# Patient Record
Sex: Male | Born: 1980 | Race: White | Hispanic: No | Marital: Married | Smoking: Current every day smoker
Health system: Southern US, Community
[De-identification: ages and names within clinical notes are randomized; demographics above are authoritative.]

## PROBLEM LIST (undated history)

## (undated) DIAGNOSIS — F32A Depression, unspecified: Secondary | ICD-10-CM

## (undated) DIAGNOSIS — I10 Essential (primary) hypertension: Secondary | ICD-10-CM

## (undated) DIAGNOSIS — F1010 Alcohol abuse, uncomplicated: Secondary | ICD-10-CM

## (undated) DIAGNOSIS — F329 Major depressive disorder, single episode, unspecified: Secondary | ICD-10-CM

## (undated) HISTORY — PX: WRIST SURGERY: SHX841

---

## 1999-07-14 ENCOUNTER — Emergency Department (HOSPITAL_COMMUNITY): Admission: EM | Admit: 1999-07-14 | Discharge: 1999-07-14 | Payer: Self-pay

## 1999-07-15 ENCOUNTER — Emergency Department (HOSPITAL_COMMUNITY): Admission: EM | Admit: 1999-07-15 | Discharge: 1999-07-15 | Payer: Self-pay

## 1999-07-15 ENCOUNTER — Encounter: Payer: Self-pay | Admitting: Emergency Medicine

## 2009-03-24 ENCOUNTER — Emergency Department (HOSPITAL_COMMUNITY): Admission: EM | Admit: 2009-03-24 | Discharge: 2009-03-24 | Payer: Self-pay | Admitting: Emergency Medicine

## 2009-10-01 ENCOUNTER — Emergency Department (HOSPITAL_COMMUNITY): Admission: EM | Admit: 2009-10-01 | Discharge: 2009-10-01 | Payer: Self-pay | Admitting: Emergency Medicine

## 2009-12-09 ENCOUNTER — Emergency Department (HOSPITAL_COMMUNITY): Admission: EM | Admit: 2009-12-09 | Discharge: 2009-12-09 | Payer: Self-pay | Admitting: Emergency Medicine

## 2010-02-20 ENCOUNTER — Emergency Department (HOSPITAL_COMMUNITY): Admission: EM | Admit: 2010-02-20 | Discharge: 2010-02-21 | Payer: Self-pay | Admitting: Emergency Medicine

## 2010-03-17 ENCOUNTER — Emergency Department (HOSPITAL_COMMUNITY): Admission: EM | Admit: 2010-03-17 | Discharge: 2010-03-17 | Payer: Self-pay | Admitting: Emergency Medicine

## 2011-01-26 LAB — RAPID URINE DRUG SCREEN, HOSP PERFORMED
Barbiturates: NOT DETECTED
Cocaine: POSITIVE — AB
Opiates: NOT DETECTED

## 2011-01-26 LAB — POCT I-STAT, CHEM 8
BUN: 11 mg/dL (ref 6–23)
Calcium, Ion: 1.13 mmol/L (ref 1.12–1.32)
Chloride: 108 mEq/L (ref 96–112)
Glucose, Bld: 116 mg/dL — ABNORMAL HIGH (ref 70–99)

## 2011-01-28 LAB — RAPID URINE DRUG SCREEN, HOSP PERFORMED
Benzodiazepines: POSITIVE — AB
Cocaine: POSITIVE — AB
Opiates: POSITIVE — AB
Tetrahydrocannabinol: NOT DETECTED

## 2011-01-28 LAB — DIFFERENTIAL
Basophils Absolute: 0.1 10*3/uL (ref 0.0–0.1)
Basophils Relative: 1 % (ref 0–1)
Eosinophils Absolute: 0.3 10*3/uL (ref 0.0–0.7)
Eosinophils Relative: 4 % (ref 0–5)
Lymphocytes Relative: 29 % (ref 12–46)
Monocytes Absolute: 0.5 10*3/uL (ref 0.1–1.0)

## 2011-01-28 LAB — CBC
HCT: 41.8 % (ref 39.0–52.0)
MCHC: 34.2 g/dL (ref 30.0–36.0)
Platelets: 222 10*3/uL (ref 150–400)
RDW: 13.3 % (ref 11.5–15.5)

## 2011-01-28 LAB — BASIC METABOLIC PANEL
BUN: 8 mg/dL (ref 6–23)
CO2: 26 mEq/L (ref 19–32)
Calcium: 9 mg/dL (ref 8.4–10.5)
GFR calc non Af Amer: >60 mL/min (ref >60)
Glucose, Bld: 97 mg/dL (ref 70–99)
Potassium: 3.7 mEq/L (ref 3.5–5.1)

## 2011-01-28 LAB — TRICYCLICS SCREEN, URINE: TCA Scrn: NOT DETECTED

## 2011-02-17 LAB — POCT I-STAT, CHEM 8
BUN: 12 mg/dL (ref 6–23)
Calcium, Ion: 1.17 mmol/L (ref 1.12–1.32)
HCT: 38 % — ABNORMAL LOW (ref 39.0–52.0)
Hemoglobin: 12.9 g/dL — ABNORMAL LOW (ref 13.0–17.0)
Sodium: 142 mEq/L (ref 135–145)
TCO2: 21 mmol/L (ref 0–100)

## 2011-10-12 ENCOUNTER — Encounter: Payer: Self-pay | Admitting: *Deleted

## 2011-10-12 ENCOUNTER — Emergency Department (HOSPITAL_COMMUNITY)
Admission: EM | Admit: 2011-10-12 | Discharge: 2011-10-13 | Disposition: A | Discharge status: Other Acute Inpatient Hospital | Payer: Self-pay | Attending: Emergency Medicine | Admitting: Emergency Medicine

## 2011-10-12 DIAGNOSIS — F3289 Other specified depressive episodes: Secondary | ICD-10-CM | POA: Insufficient documentation

## 2011-10-12 DIAGNOSIS — F329 Major depressive disorder, single episode, unspecified: Secondary | ICD-10-CM | POA: Insufficient documentation

## 2011-10-12 DIAGNOSIS — F1020 Alcohol dependence, uncomplicated: Secondary | ICD-10-CM | POA: Insufficient documentation

## 2011-10-12 DIAGNOSIS — F1910 Other psychoactive substance abuse, uncomplicated: Secondary | ICD-10-CM

## 2011-10-12 DIAGNOSIS — F1920 Other psychoactive substance dependence, uncomplicated: Secondary | ICD-10-CM | POA: Insufficient documentation

## 2011-10-12 DIAGNOSIS — R45851 Suicidal ideations: Secondary | ICD-10-CM | POA: Insufficient documentation

## 2011-10-12 DIAGNOSIS — I10 Essential (primary) hypertension: Secondary | ICD-10-CM | POA: Insufficient documentation

## 2011-10-12 DIAGNOSIS — F39 Unspecified mood [affective] disorder: Secondary | ICD-10-CM | POA: Insufficient documentation

## 2011-10-12 HISTORY — DX: Essential (primary) hypertension: I10

## 2011-10-12 HISTORY — DX: Major depressive disorder, single episode, unspecified: F32.9

## 2011-10-12 HISTORY — DX: Alcohol abuse, uncomplicated: F10.10

## 2011-10-12 HISTORY — DX: Depression, unspecified: F32.A

## 2011-10-12 LAB — CBC
HCT: 45 % (ref 39.0–52.0)
Hemoglobin: 15.5 g/dL (ref 13.0–17.0)
MCH: 30.2 pg (ref 26.0–34.0)
MCV: 87.7 fL (ref 78.0–100.0)
Platelets: 252 10*3/uL (ref 150–400)
RBC: 5.13 MIL/uL (ref 4.22–5.81)
WBC: 8.3 10*3/uL (ref 4.0–10.5)

## 2011-10-12 LAB — DIFFERENTIAL
Eosinophils Absolute: 0.1 10*3/uL (ref 0.0–0.7)
Eosinophils Relative: 1 % (ref 0–5)
Lymphocytes Relative: 24 % (ref 12–46)
Lymphs Abs: 2 10*3/uL (ref 0.7–4.0)
Monocytes Relative: 4 % (ref 3–12)

## 2011-10-12 LAB — BASIC METABOLIC PANEL
BUN: 3 mg/dL — ABNORMAL LOW (ref 6–23)
CO2: 27 mEq/L (ref 19–32)
Calcium: 9.5 mg/dL (ref 8.4–10.5)
GFR calc non Af Amer: >90 mL/min (ref >90)
Glucose, Bld: 106 mg/dL — ABNORMAL HIGH (ref 70–99)
Sodium: 144 mEq/L (ref 135–145)

## 2011-10-12 LAB — RAPID URINE DRUG SCREEN, HOSP PERFORMED: Opiates: NOT DETECTED

## 2011-10-12 LAB — ETHANOL: Alcohol, Ethyl (B): 240 mg/dL — ABNORMAL HIGH (ref 0–11)

## 2011-10-12 NOTE — ED Notes (Signed)
Pt arrived via [facility] it is reported he "wanted to blow his brains out"
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
## 2011-10-12 NOTE — ED Notes (Signed)
Act team counselor in with pt

## 2011-10-13 MED ORDER — METOPROLOL TARTRATE 50 MG PO TABS
50.0000 mg | ORAL_TABLET | Freq: Once | ORAL | Status: AC
  Administered 2011-10-13: 50 mg via ORAL
  Filled 2011-10-13: qty 1

## 2011-10-13 MED ORDER — LISINOPRIL 10 MG PO TABS
20.0000 mg | ORAL_TABLET | Freq: Every day | ORAL | Status: DC
  Administered 2011-10-13: 20 mg via ORAL
  Filled 2011-10-13 (×2): qty 1

## 2011-10-13 NOTE — ED Notes (Signed)
[facility] arrived to transport pt, pt's bp was 172/119.  Called [facility] to verify bed, stated that pt's bp must be <100 diastolic before acceptance.  MD made aware.

## 2011-10-13 NOTE — BH Assessment (Signed)
Assessment Note   Kevin House is an 30 y.o. male. Kevin House had been accepted to [facility] this am. He was waiting for transportation by [facility]. When they arrived his blood pressure and not responded to medication. Call made to [facility], they requested that his pressure be stabilized before transport.  Later Kevin House stated that  He was better and that he was not suicidal. He states that he no longer wants to go inpatient. Kevin House's father is in the ED at this time and this was addressed with him.  Axis I: Substance Induced Mood Disorder and Alcohol dependence Axis II: Deferred Axis III:  Past Medical History  Diagnosis Date  . Hypertension   . Depressed   . Alcohol abuse    Axis IV: economic problems, housing problems, occupational problems, other psychosocial or environmental problems, problems with access to health care services and problems with primary support group Axis V: 41-50 serious symptoms   Past Medical History:  Past Medical History  Diagnosis Date  . Hypertension   . Depressed   . Alcohol abuse     No past surgical history on file.  Family History: No family history on file.  Social History:  reports that he has been smoking.  He does not have any smokeless tobacco history on file. He reports that he drinks alcohol. His drug history not on file.  Allergies:  Allergies  Allergen Reactions  . Penicillins     Home Medications:  Medications Prior to Admission  Medication Dose Route Frequency Provider Last Rate Last Dose  . lisinopril (PRINIVIL,ZESTRIL) tablet 20 mg  20 mg Oral Daily Nicoletta Dress. Colon Branch, MD   20 mg at 10/13/11 1006  . metoprolol (LOPRESSOR) tablet 50 mg  50 mg Oral Once [employer]. Colon Branch, MD   50 mg at 10/13/11 0936   No current outpatient prescriptions on file as of 10/12/2011.    OB/GYN Status:  No LMP for male patient.  General Assessment Data Assessment Number: 1  Living Arrangements: Parent Can pt return to current  living arrangement?: Yes Admission Status: Voluntary Is patient capable of signing voluntary admission?: Yes Transfer from: Acute Hospital Referral Source: Self/Family/Friend  Risk to self Suicidal Ideation: Yes-Currently Present Suicidal Intent: Yes-Currently Present Is patient at risk for suicide?: Yes Suicidal Plan?: Yes-Currently Present Specify Current Suicidal Plan: BLOW BRAINS OUT WITH FATHER GUN Access to Means: Yes Specify Access to Suicidal Means: FATHER'S GUN What has been your use of drugs/alcohol within the last 12 months?: PT HAS BEEN USING ETOH, OPIOIDS, BENZOS SINCE THE AGE OF 15. BENZOS: USES 83M DAILY FOR 48YRS, LAST USE WAS 3 DAYS AGO; HEROINE: USES 4G DAILY & LAST USE WAS JULY 31ST; ETOH  VARIES & LAST USE WAS TODAY 2 PINTS; OXYCOTON USES 10- 40 MG DAILY & LAST USE WAS 1 WK AGO Other Self Harm Risks: NA Triggers for Past Attempts: Family contact;Other (Comment) (DRUG USE, RELATIONSHIP ISSUES) Intentional Self Injurious Behavior: None Factors that decrease suicide risk: Positive coping skills Family Suicide History: No Recent stressful life event(s): Job Loss;Financial Problems;Conflict (Comment) (RELATIONSHIP ISSUES WITH PARENTS & GF) Persecutory voices/beliefs?: No Depression: Yes Depression Symptoms: Tearfulness;Fatigue;Feeling angry/irritable;Loss of interest in usual pleasures;Feeling worthless/self pity Substance abuse history and/or treatment for substance abuse?: Yes Suicide prevention information given to non-admitted patients: Not applicable  Risk to Others Homicidal Ideation: No Thoughts of Harm to Others: No Current Homicidal Intent: No Current Homicidal Plan: No Access to Homicidal Means: No Identified Victim: NA  History of harm to others?: No Assessment of Violence: None Noted Violent Behavior Description: EMOTIOAL, TEARFUL, SLIGHT AGITATION Does patient have access to weapons?: Yes (Comment) Criminal Charges Pending?: No Does patient have a  court date: No  Mental Status Report Appear/Hygiene: Other (Comment) (NEAT) Eye Contact: Fair Motor Activity: Agitation;Mannerisms Speech: Pressured Level of Consciousness: Crying Mood: Depressed;Angry;Despair;Irritable Affect: Angry;Depressed;Irritable;Sad Anxiety Level: Minimal Thought Processes: Coherent;Relevant Judgement: Impaired Orientation: Person;Place;Time;Situation Obsessive Compulsive Thoughts/Behaviors: None  Cognitive Functioning Concentration: Decreased Memory: Recent Intact;Remote Intact IQ: Average Insight: Poor Impulse Control: Poor Appetite: Fair Weight Loss: 0  Weight Gain: 0  Sleep: Decreased Total Hours of Sleep: 4  Vegetative Symptoms: None  Prior Inpatient/Outpatient Therapy Prior Therapy: Inpatient Prior Therapy Dates: 2011; 2012 Prior Therapy Facilty/Provider(s): [facility] Reason for Treatment: DETOX & REHAB                     Additional Information 1:1 In Past 12 Months?: No CIRT Risk: No Elopement Risk: No Does patient have medical clearance?: Yes     Disposition:  Disposition Disposition of Patient: Inpatient treatment program;Referred to [facility], [facility]) Type of inpatient treatment program: Adult Patient referred to: Other (Comment) ([facility], [facility] & [facility])  The patient declined admission to [facility] today. He decided that he was going home. He contacted his father to come for him. The patient and his father agreed for him to return under contract for safety. The patient will continue with NA/AA meetings. He declined a referral to [facility]. Patient discharged by Dr Colon Branch to home. On Site Evaluation by:   Reviewed with Physician:     Jake Shark [facility] 10/13/2011 3:04 PM

## 2011-10-13 NOTE — ED Notes (Signed)
[facility] called for transport to [facility].  Thomas at [facility] be awhile, but will dispatch a truck as soon as possible".

## 2011-10-13 NOTE — ED Notes (Signed)
Pt wanting to leave AMA.  Notified edp, and Tommy form ACT team.  Tommy at bedside with pt and pt's father.

## 2011-10-13 NOTE — ED Provider Notes (Signed)
History     CSN: [id number] Arrival date & time: 10/12/2011  9:38 PM   None     Chief Complaint  Patient presents with  . Suicidal    (Consider location/radiation/quality/duration/timing/severity/associated sxs/prior treatment) HPI Comments: PT HAS BEEN ABUSING HEROIN, PERCOCET AND "ANYTHING ELSE I CAN PUT IN A NEEDLE AND SHOVE IN MY VEINS"`  Having suicidal thoughts tonight.  States he went to a job interview today and "things didn't go well".  He does not feel that he needs detox just help with suicidal ideation.  His plan is to "shoot myself".  Relates that he has multiple firearms around his house.  Says he came to the ED of his own volition  The history is provided by the patient. No language interpreter was used.    Past Medical History  Diagnosis Date  . Hypertension   . Depressed   . Alcohol abuse     No past surgical history on file.  No family history on file.  History  Substance Use Topics  . Smoking status: Current Everyday Smoker  . Smokeless tobacco: Not on file  . Alcohol Use: Yes      Review of Systems  Psychiatric/Behavioral: Positive for suicidal ideas.    Allergies  Penicillins  Home Medications  No current outpatient prescriptions on file.  There were no vitals taken for this visit.  Physical Exam  Nursing note and vitals reviewed. Constitutional: He is oriented to person, place, and time. He appears well-developed and well-nourished.  HENT:  Head: Normocephalic and atraumatic.  Eyes: EOM are normal.  Neck: Normal range of motion.  Cardiovascular: Normal rate, regular rhythm, normal heart sounds and intact distal pulses.  Exam reveals no gallop and no friction rub.   No murmur heard. Pulmonary/Chest: Effort normal and breath sounds normal. No respiratory distress. He has no wheezes. He has no rales. He exhibits no tenderness.  Abdominal: Soft. Bowel sounds are normal. He exhibits no distension. There is no tenderness. There is no  rigidity, no rebound, no guarding and no CVA tenderness.  Musculoskeletal: Normal range of motion.  Neurological: He is alert and oriented to person, place, and time.  Skin: Skin is warm and dry.  Psychiatric: His speech is normal. Judgment normal. His mood appears anxious. He is agitated. Thought content is not paranoid and not delusional. He exhibits a depressed mood. He expresses suicidal ideation. He expresses no homicidal ideation. He expresses suicidal plans. He expresses no homicidal plans.    ED Course  Procedures (including critical care time)  Labs Reviewed  ETHANOL - Abnormal; Notable for the following:    Alcohol, Ethyl (B) 240 (*)    All other components within normal limits  BASIC METABOLIC PANEL - Abnormal; Notable for the following:    Glucose, Bld 106 (*)    BUN 3 (*)    All other components within normal limits  URINE RAPID DRUG SCREEN (HOSP PERFORMED)  CBC  DIFFERENTIAL   No results found.   No diagnosis found.    MDM  12:50 AMdiscussed with dr. Jeraldine Loots who has assumed care.  Pt has already spoken with ella from [facility] who is working on placement.        Worthy Rancher, PA 10/25/11 1343

## 2011-10-13 NOTE — ED Provider Notes (Signed)
The patient has been accepted to [facility], and will be transferred this morning. He is awake and cooperative  Gerhard Munch, MD 10/13/11 [phone]

## 2011-10-13 NOTE — Progress Notes (Signed)
4098 Patient here awaiting bed. H/o polysubstance abuse wanting detox. He has a h/o hypertension and has not been taking his medicines for over 3 months. Will restart his regular medicines. Do not expect to have his blood pressure normalized while in the ER.  0900 Advised that patient has been accepted by Dr. Betti Cruz at [facility]. Patient is stable and medically cleared to go.

## 2011-10-13 NOTE — ED Notes (Signed)
[facility] called and gave report, in route to transport pt ETA 15 min

## 2011-10-25 NOTE — ED Provider Notes (Signed)
Medical screening examination/treatment/procedure(s) were performed by non-physician practitioner and as supervising physician I was immediately available for consultation/collaboration.   Gerhard Munch, MD 10/25/11 [phone]

## 2016-09-28 ENCOUNTER — Ambulatory Visit (INDEPENDENT_AMBULATORY_CARE_PROVIDER_SITE_OTHER): Payer: Self-pay | Admitting: Orthopaedic Surgery

## 2016-10-05 ENCOUNTER — Encounter (INDEPENDENT_AMBULATORY_CARE_PROVIDER_SITE_OTHER): Payer: Self-pay | Admitting: Orthopaedic Surgery

## 2016-10-05 ENCOUNTER — Ambulatory Visit (INDEPENDENT_AMBULATORY_CARE_PROVIDER_SITE_OTHER): Payer: BLUE CROSS/BLUE SHIELD | Admitting: Orthopaedic Surgery

## 2016-10-05 ENCOUNTER — Ambulatory Visit (INDEPENDENT_AMBULATORY_CARE_PROVIDER_SITE_OTHER): Payer: Self-pay

## 2016-10-05 VITALS — BP 183/119 | HR 82 | Ht 72.0 in | Wt 235.0 lb

## 2016-10-05 DIAGNOSIS — M25511 Pain in right shoulder: Secondary | ICD-10-CM

## 2016-10-05 DIAGNOSIS — G8929 Other chronic pain: Secondary | ICD-10-CM | POA: Diagnosis not present

## 2016-10-05 MED ORDER — METHYLPREDNISOLONE ACETATE 40 MG/ML IJ SUSP
80.0000 mg | INTRAMUSCULAR | Status: AC | PRN
  Administered 2016-10-05: 80 mg

## 2016-10-05 MED ORDER — LIDOCAINE HCL 1 % IJ SOLN
2.0000 mL | INTRAMUSCULAR | Status: AC | PRN
  Administered 2016-10-05: 2 mL

## 2016-10-05 MED ORDER — BUPIVACAINE HCL 0.5 % IJ SOLN
2.0000 mL | INTRAMUSCULAR | Status: AC | PRN
  Administered 2016-10-05: 2 mL via INTRA_ARTICULAR

## 2016-10-05 NOTE — Progress Notes (Signed)
Office Visit Note   Patient: Kevin PedroDavid R Fix Jr.           Date of Birth: Apr 10, 1981           MRN: [id number] Visit Date: 10/05/2016              Requested by: No referring provider defined for this encounter. PCP: Luvenia StarchSANTAYANA, GLORIA PATRICIA, DO   Assessment & Plan: Impingement syndrome right shoulder. I will plan on performing a subacromial cortisone injection and monitor his response. Also monitor  the numbness and tingling into the long and ring finger  right hand Visit Diagnoses: No diagnosis found.  Plan: f/u in 2 weeks. I would like to monitor the response to the cortisone of the right shoulder and also to evaluate the numbness and tingling is experiencing in his right hand that might be carpal tunnel syndrome.  Follow-Up Instructions: No Follow-up on file.   Orders:  No orders of the defined types were placed in this encounter.  No orders of the defined types were placed in this encounter.     Procedures: Large Joint Inj Date/Time: 10/05/2016 5:40 PM Performed by: Valeria BatmanWHITFIELD, Shelisha Gautier W Authorized by: Valeria BatmanWHITFIELD, Jesenya Bowditch W   Consent Given by:  Patient Timeout: prior to procedure the correct patient, procedure, and site was verified   Indications:  Pain Location:  Shoulder Site:  L subacromial bursa Prep: patient was prepped and draped in usual sterile fashion   Needle Size:  25 G Needle Length:  1.5 inches Approach:  Lateral Ultrasound Guidance: No   Fluoroscopic Guidance: No   Arthrogram: No   Medications:  80 mg methylPREDNISolone acetate 40 MG/ML; 2 mL lidocaine 1 %; 2 mL bupivacaine 0.5 % Aspiration Attempted: No   Patient tolerance:  Patient tolerated the procedure well with no immediate complications     Clinical Data: No additional findings.   Subjective: No chief complaint on file.   Pt Right shoulder "popping and grinding"  Denies injury Pt has had numbness and tingling in his Right hand  Extreme pain for 2 months.  Kevin House has been  experiencing persistent right shoulder pain over a period of months. 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 He denies a specific injury or trauma.. The pain is localized along the anterior aspect of the shoulder and oftentimes associated with "popping"  In addition he's had some trouble with numbness in the long and ring finger on occasion. Sometimes it occurs when he is driving a car and sometimes occurs when he is sleeping.  Review of Systems   Objective: Vital Signs: There were no vitals taken for this visit.  Physical Exam  Ortho Exam on examination of the right shoulder there was impingement on the extreme of external rotation. There was also a positive grind test. There was local tenderness over the anterior aspect of the shoulder beneath the acromion. There was no pain  at the acromioclavicular joint. Full range of motion of the shoulder with a minimally positive painful arc.   negative Tinel's at the wrist. Full range of motion of his hand and wrist.  Specialty Comments:  No specialty comments available.  Imaging: No results found.   PMFS History: There are no active problems to display for this patient.  Past Medical History:  Diagnosis Date  . Alcohol abuse   . Depressed   . Hypertension     No family history on file.  No past  surgical history on file. Social History   Occupational History  . Not on file.   Social History Main Topics  . Smoking status: Current Every Day Smoker  . Smokeless tobacco: Not on file  . Alcohol use Yes  . Drug use: Unknown  . Sexual activity: Not on file

## 2016-10-26 ENCOUNTER — Encounter (INDEPENDENT_AMBULATORY_CARE_PROVIDER_SITE_OTHER): Payer: Self-pay | Admitting: Orthopaedic Surgery

## 2016-10-26 ENCOUNTER — Ambulatory Visit (INDEPENDENT_AMBULATORY_CARE_PROVIDER_SITE_OTHER): Payer: BLUE CROSS/BLUE SHIELD | Admitting: Orthopaedic Surgery

## 2016-10-26 VITALS — BP 156/104 | HR 77 | Ht 72.0 in | Wt 235.0 lb

## 2016-10-26 DIAGNOSIS — M25511 Pain in right shoulder: Secondary | ICD-10-CM | POA: Diagnosis not present

## 2016-10-26 DIAGNOSIS — G8929 Other chronic pain: Secondary | ICD-10-CM

## 2016-10-26 NOTE — Progress Notes (Signed)
   Office Visit Note   Patient: Kevin PedroDavid R Crisanto Jr.           Date of Birth: 1981-06-15           MRN: [id number] Visit Date: 10/26/2016              Requested by: Kevin MullGloria House Santayana, Kevin House [address] [place], [place] [id number] PCP: Kevin House, Kevin PATRICIA, Kevin House   Assessment & Plan Visit Diagnoses: Resolved right shoulder pain status post subacromial cortisone injection. Chronic numbness and tingling into this right hand involving possibly ulna and median nerve   Plan: EMG and nerve conduction studies to right upper extremity Follow-Up Instructions: No Follow-up on file.   Orders:  No orders of the defined types were placed in this encounter.  No orders of the defined types were placed in this encounter.     Procedures: No procedures performed   Clinical Data: No additional findings.   Subjective: No chief complaint on file.   Chronic Right shoulder pain and some numbness in his hand "when I'm talking on the phone and my arm is on the armrest"  Discomfort and tingling is experiencing predominantly in the index long and ring finger  Review of Systems   Objective: Vital Signs: There were no vitals taken for this visit.  Physical Exam  Ortho Exam complete painless range of motion of right shoulder. Impingement testing is negative. Excellent strength. There were no localized areas of tenderness.  Examination of the right hand reveals no Tinel's over the median nerve. Good grip and good release. Normal sensibility today with normal motor exam. There was a Tinel's over the ulnar nerve at the right elbow  Specialty Comments:  No specialty comments available.  Imaging: No results found.   PMFS History: There are no active problems to display for this patient.  Past Medical History:  Diagnosis Date  . Alcohol abuse   . Depressed   . Hypertension     No family history on file.  No past surgical history on file. Social History   Occupational History    . Not on file.   Social History Main Topics  . Smoking status: Current Every Day Smoker  . Smokeless tobacco: Not on file  . Alcohol use Yes  . Drug use: Unknown  . Sexual activity: Not on file

## 2016-11-19 ENCOUNTER — Encounter: Payer: Self-pay | Admitting: *Deleted

## 2016-11-19 ENCOUNTER — Emergency Department
Admission: EM | Admit: 2016-11-19 | Discharge: 2016-11-19 | Disposition: A | Discharge status: Home / Self Care | Payer: BLUE CROSS/BLUE SHIELD | Attending: Emergency Medicine | Admitting: Emergency Medicine

## 2016-11-19 ENCOUNTER — Emergency Department: Payer: BLUE CROSS/BLUE SHIELD

## 2016-11-19 DIAGNOSIS — I10 Essential (primary) hypertension: Secondary | ICD-10-CM | POA: Diagnosis not present

## 2016-11-19 DIAGNOSIS — R51 Headache: Secondary | ICD-10-CM | POA: Diagnosis present

## 2016-11-19 DIAGNOSIS — Z79899 Other long term (current) drug therapy: Secondary | ICD-10-CM | POA: Insufficient documentation

## 2016-11-19 DIAGNOSIS — G43009 Migraine without aura, not intractable, without status migrainosus: Secondary | ICD-10-CM

## 2016-11-19 DIAGNOSIS — F17200 Nicotine dependence, unspecified, uncomplicated: Secondary | ICD-10-CM | POA: Diagnosis not present

## 2016-11-19 LAB — CBC WITH DIFFERENTIAL/PLATELET
Basophils Absolute: 0 10*3/uL (ref 0–0.1)
Basophils Relative: 1 %
EOS ABS: 0.2 10*3/uL (ref 0–0.7)
EOS PCT: 4 %
HCT: 43.9 % (ref 40.0–52.0)
HEMOGLOBIN: 15.1 g/dL (ref 13.0–18.0)
Lymphocytes Relative: 28 %
Lymphs Abs: 1.5 10*3/uL (ref 1.0–3.6)
MCH: 32.1 pg (ref 26.0–34.0)
MCHC: 34.5 g/dL (ref 32.0–36.0)
MCV: 93.2 fL (ref 80.0–100.0)
MONOS PCT: 8 %
Monocytes Absolute: 0.4 10*3/uL (ref 0.2–1.0)
NEUTROS PCT: 59 %
Neutro Abs: 3.2 10*3/uL (ref 1.4–6.5)
PLATELETS: 226 10*3/uL (ref 150–440)
RBC: 4.71 MIL/uL (ref 4.40–5.90)
RDW: 13.4 % (ref 11.5–14.5)
WBC: 5.4 10*3/uL (ref 3.8–10.6)

## 2016-11-19 LAB — COMPREHENSIVE METABOLIC PANEL
ALK PHOS: 60 U/L (ref 38–126)
ALT: 18 U/L (ref 17–63)
AST: 23 U/L (ref 15–41)
Albumin: 4.1 g/dL (ref 3.5–5.0)
Anion gap: 7 (ref 5–15)
BUN: 12 mg/dL (ref 6–20)
CALCIUM: 9.6 mg/dL (ref 8.9–10.3)
CO2: 26 mmol/L (ref 22–32)
CREATININE: 0.9 mg/dL (ref 0.61–1.24)
Chloride: 107 mmol/L (ref 101–111)
Glucose, Bld: 106 mg/dL — ABNORMAL HIGH (ref 65–99)
Potassium: 3.9 mmol/L (ref 3.5–5.1)
SODIUM: 140 mmol/L (ref 135–145)
Total Bilirubin: 0.8 mg/dL (ref 0.3–1.2)
Total Protein: 7 g/dL (ref 6.5–8.1)

## 2016-11-19 MED ORDER — LISINOPRIL-HYDROCHLOROTHIAZIDE 10-12.5 MG PO TABS: 1.0000 | ORAL_TABLET | Freq: Every day | ORAL | 2 refills | Status: AC

## 2016-11-19 MED ORDER — METOCLOPRAMIDE HCL 5 MG/ML IJ SOLN
10.0000 mg | Freq: Once | INTRAMUSCULAR | Status: AC
  Administered 2016-11-19: 10 mg via INTRAVENOUS
  Filled 2016-11-19: qty 2

## 2016-11-19 MED ORDER — CLONIDINE HCL 0.1 MG PO TABS
0.2000 mg | ORAL_TABLET | Freq: Once | ORAL | Status: AC
  Administered 2016-11-19: 0.2 mg via ORAL
  Filled 2016-11-19: qty 2

## 2016-11-19 MED ORDER — LISINOPRIL 10 MG PO TABS
10.0000 mg | ORAL_TABLET | Freq: Once | ORAL | Status: AC
  Administered 2016-11-19: 10 mg via ORAL
  Filled 2016-11-19: qty 1

## 2016-11-19 MED ORDER — DIPHENHYDRAMINE HCL 50 MG/ML IJ SOLN
50.0000 mg | Freq: Once | INTRAMUSCULAR | Status: AC
  Administered 2016-11-19: 50 mg via INTRAVENOUS
  Filled 2016-11-19: qty 1

## 2016-11-19 MED ORDER — HYDROCHLOROTHIAZIDE 25 MG PO TABS
25.0000 mg | ORAL_TABLET | Freq: Every day | ORAL | Status: DC
  Administered 2016-11-19: 25 mg via ORAL
  Filled 2016-11-19: qty 1

## 2016-11-19 MED ORDER — SODIUM CHLORIDE 0.9 % IV BOLUS (SEPSIS)
1000.0000 mL | Freq: Once | INTRAVENOUS | Status: AC
  Administered 2016-11-19: 1000 mL via INTRAVENOUS

## 2016-11-19 MED ORDER — KETOROLAC TROMETHAMINE 30 MG/ML IJ SOLN
30.0000 mg | Freq: Once | INTRAMUSCULAR | Status: AC
  Administered 2016-11-19: 30 mg via INTRAVENOUS
  Filled 2016-11-19: qty 1

## 2016-11-19 NOTE — ED Triage Notes (Signed)
Patient c/o headache for 3 days that began behind his eyes and now is in the back of head and neck. Patient has a history of blood pressure, but has not had them for 2 months.

## 2016-11-19 NOTE — ED Provider Notes (Signed)
[facility] Emergency Department Provider Note  Time seen: 4:12 PM  I have reviewed the triage vital signs and the nursing notes.   HISTORY  Chief Complaint Headache    HPI Kevin PedroDavid R Amison Jr. is a 36 y.o. male with a past medical history of alcohol use, depression and hypertension who presents the emergency department a headache for the past 3 days. According to the patient for the past 3 days he has had a significant headache located behind his eyes. Patient states he has been off blood pressure medications for the past one month, upon arrival to the emergency department patient has an elevated blood pressure around 200 systolic.Patient denies any fever or neck pain. Denies any focal weakness or numbness.  Past Medical History:  Diagnosis Date  . Alcohol abuse   . Depressed   . Hypertension     There are no active problems to display for this patient.   Past Surgical History:  Procedure Laterality Date  . WRIST SURGERY Right     Prior to Admission medications   Medication Sig Start Date End Date Taking? Authorizing Provider  amoxicillin (AMOXIL) 500 MG capsule  10/21/16   Historical Provider, MD  citalopram (CELEXA) 40 MG tablet Take 40 mg by mouth daily.    Historical Provider, MD  losartan-hydrochlorothiazide (HYZAAR) 50-12.5 MG tablet Take 1 tablet by mouth daily.    Historical Provider, MD  metoprolol (LOPRESSOR) 50 MG tablet Take 50 mg by mouth 2 (two) times daily.    Historical Provider, MD  ranitidine (ZANTAC) 150 MG tablet Take 150 mg by mouth 2 (two) times daily.    Historical Provider, MD  VIRTUSSIN A/C 100-10 MG/5ML syrup TK 5 ML PO Q 4 TO 6 HOURS PRN 10/22/16   Historical Provider, MD    Allergies  Allergen Reactions  . Penicillins     No family history on file.  Social History Social History  Substance Use Topics  . Smoking status: Current Every Day Smoker  . Smokeless tobacco: Never Used  . Alcohol use Yes    Review of  Systems Constitutional: Negative for fever. Cardiovascular: Negative for chest pain. Respiratory: Negative for shortness of breath. Gastrointestinal: Negative for abdominal pain Neurological: Moderate headache. Denies focal weakness or numbness. 10-point ROS otherwise negative.  ____________________________________________   PHYSICAL EXAM:  VITAL SIGNS: ED Triage Vitals  Enc Vitals Group     BP 11/19/16 1426 (!) 200/129     Pulse Rate 11/19/16 1426 91     Resp 11/19/16 1426 18     Temp 11/19/16 1426 98.1 F (36.7 C)     Temp Source 11/19/16 1426 Oral     SpO2 11/19/16 1426 97 %     Weight 11/19/16 1427 240 lb (108.9 kg)     Height 11/19/16 1427 6' (1.829 m)     Head Circumference --      Peak Flow --      Pain Score 11/19/16 1427 6     Pain Loc --      Pain Edu? --      Excl. in GC? --    Constitutional: Alert and oriented. Well appearing and in no distress. Eyes: Normal exam, Mild photophobia stated during exam. ENT   Head: Normocephalic and atraumatic.   Mouth/Throat: Mucous membranes are moist. Cardiovascular: Normal rate, regular rhythm. No murmur Respiratory: Normal respiratory effort without tachypnea nor retractions. Breath sounds are clear Gastrointestinal: Soft and nontender. No distention.   Musculoskeletal: Nontender with normal  range of motion in all extremities.  Neurologic:  Normal speech and language. No gross focal neurologic deficits. Equal grip strengths bilaterally. No pronator drift. Cranial nerves intact. Skin:  Skin is warm, dry and intact.  Psychiatric: Mood and affect are normal.   ____________________________________________    EKG  EKG reviewed and interpreted by myself shows normal sinus rhythm at 84 bpm, left axis deviation, normal intervals with no concerning ST changes.  ____________________________________________    RADIOLOGY  CT head negative  ____________________________________________   INITIAL IMPRESSION /  ASSESSMENT AND PLAN / ED COURSE  Pertinent labs & imaging results that were available during my care of the patient were reviewed by me and considered in my medical decision making (see chart for details).  Patient presents for 3 days of headache and elevated blood pressure. Patient's labs are largely within normal limits, EKG is normal. We'll obtain a CT scan of the head given no history of migraines or prolonged headaches. Patient's blood pressure initially elevated around 200 systolic he has been off of his blood pressure medications for greater than 1 month as he ran out and had no primary care doctor. We will start the patient on lisinopril and hydrochlorothiazide as this is available on the $4 list at [facility]. Patient is agreeable to this plan.  CT head is negative. Patient states he is feeling much better. We will discharge home on lisinopril/hydrochlorothiazide. I asked the patient about history of angioedema, he denies.  ____________________________________________   FINAL CLINICAL IMPRESSION(S) / ED DIAGNOSES  Headache hypertension   Minna Antis, MD 11/19/16 1744

## 2016-11-27 ENCOUNTER — Encounter (INDEPENDENT_AMBULATORY_CARE_PROVIDER_SITE_OTHER): Payer: BLUE CROSS/BLUE SHIELD | Admitting: Physical Medicine and Rehabilitation

## 2017-06-29 ENCOUNTER — Encounter: Payer: Self-pay | Admitting: *Deleted

## 2017-06-29 ENCOUNTER — Emergency Department
Admission: EM | Admit: 2017-06-29 | Discharge: 2017-06-29 | Disposition: A | Discharge status: Home / Self Care | Payer: Worker's Compensation | Attending: Emergency Medicine | Admitting: Emergency Medicine

## 2017-06-29 ENCOUNTER — Emergency Department: Payer: Worker's Compensation

## 2017-06-29 DIAGNOSIS — Y939 Activity, unspecified: Secondary | ICD-10-CM | POA: Diagnosis not present

## 2017-06-29 DIAGNOSIS — Y929 Unspecified place or not applicable: Secondary | ICD-10-CM | POA: Insufficient documentation

## 2017-06-29 DIAGNOSIS — W11XXXA Fall on and from ladder, initial encounter: Secondary | ICD-10-CM | POA: Diagnosis not present

## 2017-06-29 DIAGNOSIS — S82832A Other fracture of upper and lower end of left fibula, initial encounter for closed fracture: Secondary | ICD-10-CM | POA: Insufficient documentation

## 2017-06-29 DIAGNOSIS — F17200 Nicotine dependence, unspecified, uncomplicated: Secondary | ICD-10-CM | POA: Diagnosis not present

## 2017-06-29 DIAGNOSIS — I10 Essential (primary) hypertension: Secondary | ICD-10-CM | POA: Insufficient documentation

## 2017-06-29 DIAGNOSIS — S161XXA Strain of muscle, fascia and tendon at neck level, initial encounter: Secondary | ICD-10-CM | POA: Insufficient documentation

## 2017-06-29 DIAGNOSIS — S80921A Unspecified superficial injury of right lower leg, initial encounter: Secondary | ICD-10-CM | POA: Diagnosis present

## 2017-06-29 DIAGNOSIS — Z79899 Other long term (current) drug therapy: Secondary | ICD-10-CM | POA: Diagnosis not present

## 2017-06-29 DIAGNOSIS — S82831A Other fracture of upper and lower end of right fibula, initial encounter for closed fracture: Secondary | ICD-10-CM

## 2017-06-29 DIAGNOSIS — Y990 Civilian activity done for income or pay: Secondary | ICD-10-CM | POA: Diagnosis not present

## 2017-06-29 DIAGNOSIS — W19XXXA Unspecified fall, initial encounter: Secondary | ICD-10-CM

## 2017-06-29 MED ORDER — IBUPROFEN 600 MG PO TABS: 600.0000 mg | ORAL_TABLET | Freq: Three times a day (TID) | ORAL | 0 refills | Status: AC | PRN

## 2017-06-29 MED ORDER — OXYCODONE-ACETAMINOPHEN 5-325 MG PO TABS: 1.0000 | ORAL_TABLET | Freq: Four times a day (QID) | ORAL | 0 refills | Status: AC | PRN

## 2017-06-29 NOTE — ED Provider Notes (Signed)
[facility] Emergency Department Provider Note   ____________________________________________   First MD Initiated Contact with Patient 06/29/17 1159     (approximate)  I have reviewed the triage vital signs and the nursing notes.   HISTORY  Chief Complaint Fall   HPI Kevin House. is a 36 y.o. male is here today after falling off a ladder at work today. Patient states that he fell approximately 4 feet and landed on his right leg. He continued to work approximately one hour after he fell. He continues to have pain in his neck and also just below his right knee. He has continued to ambulate without assistance but gradually the pain has increased. He denies any loss of consciousness, nausea, vomiting, visual changes or headache. Patient rates his pain as an 8 out of 10.   Past Medical History:  Diagnosis Date  . Alcohol abuse   . Depressed   . Hypertension     There are no active problems to display for this patient.   Past Surgical History:  Procedure Laterality Date  . WRIST SURGERY Right     Prior to Admission medications   Medication Sig Start Date End Date Taking? Authorizing Provider  citalopram (CELEXA) 40 MG tablet Take 40 mg by mouth daily.    [provider]  ibuprofen (ADVIL,MOTRIN) 600 MG tablet Take 1 tablet (600 mg total) by mouth every 8 (eight) hours as needed. 06/29/17   Tommi Rumps, PA-C  lisinopril-hydrochlorothiazide (PRINZIDE,ZESTORETIC) 10-12.5 MG tablet Take 1 tablet by mouth daily. 11/19/16   Minna Antis, MD  losartan-hydrochlorothiazide (HYZAAR) 50-12.5 MG tablet Take 1 tablet by mouth daily.    [provider]  metoprolol (LOPRESSOR) 50 MG tablet Take 50 mg by mouth 2 (two) times daily.    [provider]  oxyCODONE-acetaminophen (ROXICET) 5-325 MG tablet Take 1 tablet by mouth every 6 (six) hours as needed. 06/29/17 06/29/18  Tommi Rumps, PA-C  ranitidine (ZANTAC) 150 MG  tablet Take 150 mg by mouth 2 (two) times daily.    [provider]  VIRTUSSIN A/C 100-10 MG/5ML syrup TK 5 ML PO Q 4 TO 6 HOURS PRN 10/22/16   [provider]    Allergies Penicillins  History reviewed. No pertinent family history.  Social History Social History  Substance Use Topics  . Smoking status: Current Every Day Smoker  . Smokeless tobacco: Never Used  . Alcohol use Yes    Review of Systems Constitutional: No fever/chills Eyes: No visual changes. ENT: No trauma Cardiovascular: Denies chest pain. Respiratory: Denies shortness of breath. Gastrointestinal: No abdominal pain.  No nausea, no vomiting.  Musculoskeletal: Positive for neck pain. Positive for right lower extremity pain. Skin: Negative for rash. Neurological: Negative for headaches, negative for focal weakness or numbness.   ____________________________________________   PHYSICAL EXAM:  VITAL SIGNS: ED Triage Vitals [06/29/17 1134]  Enc Vitals Group     BP      Pulse      Resp      Temp      Temp src      SpO2      Weight 250 lb (113.4 kg)     Height 6' (1.829 m)     Head Circumference      Peak Flow      Pain Score 8     Pain Loc      Pain Edu?      Excl. in GC?    Constitutional: Alert  and oriented. Well appearing and in no acute distress. Eyes: Conjunctivae are normal. PERRL. EOMI. Head: Atraumatic. Nose: No trauma Neck: No stridor.  Nontender cervical spine to palpation posteriorly. Range of motion is within normal limits without pain or restriction. Cardiovascular: Normal rate, regular rhythm. Grossly normal heart sounds.  Good peripheral circulation. Respiratory: Normal respiratory effort.  No retractions. Lungs CTAB. Gastrointestinal: Soft and nontender. No distention. No ecchymosis. No CVA tenderness. Musculoskeletal: Moves upper extremities finds difficulty. On examination of the right lower extremity there is moderate tenderness on palpation of the proximal  lateral lower leg. No gross deformity. There is some soft tissue swelling present. Patient is able to flex and extend his lower leg but increases his pain. Neurologic:  Normal speech and language. No gross focal neurologic deficits are appreciated. Skin:  Skin is warm, dry.  Skin intact. Psychiatric: Mood and affect are normal. Speech and behavior are normal.  ____________________________________________   LABS (all labs ordered are listed, but only abnormal results are displayed)  Labs Reviewed - No data to display ____________________________________________   RADIOLOGY  Dg Cervical Spine 2-3 Views  Result Date: 06/29/2017 CLINICAL DATA:  Status post fall. EXAM: CERVICAL SPINE - 2-3 VIEW COMPARISON:  None. FINDINGS: There is no evidence of cervical spine fracture or prevertebral soft tissue swelling. Alignment is normal. No other significant bone abnormalities are identified. IMPRESSION: Negative cervical spine radiographs. Electronically Signed   By: Elige Ko   On: 06/29/2017 12:56   Dg Tibia/fibula Right  Result Date: 06/29/2017 CLINICAL DATA:  Fall. EXAM: RIGHT TIBIA AND FIBULA - 2 VIEW COMPARISON:  No recent. FINDINGS: Diffuse soft tissue swelling . Slightly displaced fracture of the proximal fibula is noted. Tibia is intact. IMPRESSION: Slightly displaced proximal fibular fracture.  Tibia is intact. Electronically Signed   By: Maisie Fus  Register   On: 06/29/2017 12:48    ____________________________________________   PROCEDURES  Procedure(s) performed: None  Procedures  Critical Care performed: No  ____________________________________________   INITIAL IMPRESSION / ASSESSMENT AND PLAN / ED COURSE  Pertinent labs & imaging results that were available during my care of the patient were reviewed by me and considered in my medical decision making (see chart for details).   Patient was placed in a long knee immobilizer and given crutches. He is to ice and elevate his  leg to reduce swelling. This was made Workmen's Comp. and he was given a note with restrictions to give to his company. He was made aware that his company wants him to have a drug screen done in [place] and his wife will drive him. He is not given any narcotics while in the emergency department. He is discharged with a prescription for Percocet as needed for pain and ibuprofen. He will follow up with Dr. Joice Lofts unless his company has an orthopedist that they prefer.   ___________________________________________   FINAL CLINICAL IMPRESSION(S) / ED DIAGNOSES  Final diagnoses:  Closed fracture of proximal end of right fibula, unspecified fracture morphology, initial encounter  Cervical strain, acute, initial encounter  Fall, initial encounter      NEW MEDICATIONS STARTED DURING THIS VISIT:  Discharge Medication List as of 06/29/2017  2:19 PM    START taking these medications   Details  ibuprofen (ADVIL,MOTRIN) 600 MG tablet Take 1 tablet (600 mg total) by mouth every 8 (eight) hours as needed., Starting Tue 06/29/2017, Print    oxyCODONE-acetaminophen (ROXICET) 5-325 MG tablet Take 1 tablet by mouth every 6 (six) hours as needed., Starting Tue  06/29/2017, Until Wed 06/29/2018, Print         Note:  This document was prepared using Dragon voice recognition software and may include unintentional dictation errors.    Tommi Rumps, PA-C 06/29/17 1645    Merrily Brittle, MD 06/30/17 [phone]

## 2017-06-29 NOTE — ED Notes (Signed)
Per pt, his boss told him to go to [employer] on [address] for [employer].

## 2017-06-29 NOTE — Discharge Instructions (Signed)
Follow-up with Dr. Joice Lofts who is on-call for orthopedics. He will need to call and make an appointment at his office in [facility].  Wear knee immobilizer for support and protection. Ice and elevate to reduce swelling. Use crutches when walking. Ibuprofen for inflammation and pain. This medication will not cause any drowsiness. Percocet 1 every 6 hours as needed for pain. This medication could cause drowsiness and increase your risk for falling.  Give work restrictions to Proofreader.

## 2017-06-29 NOTE — ED Triage Notes (Signed)
Pt to ed with c/o fall today off of a ladder,  Pt reports approximate fall of 4 feet, landed on right leg, now with pain to right lower leg below knee.  Pt also reports pain in neck.  Unsure if he hit his head but denies LOC.

## 2017-06-29 NOTE — ED Notes (Signed)
Patient taken to imaging. 

## 2017-11-26 IMAGING — CR DG TIBIA/FIBULA 2V*R*
4 series · 4 of 4 positions shown · non-contrast
Comparison: No recent.

CLINICAL DATA: Fall.

EXAM:
RIGHT TIBIA AND FIBULA - 2 VIEW

[tibia ap (1 of 2)]
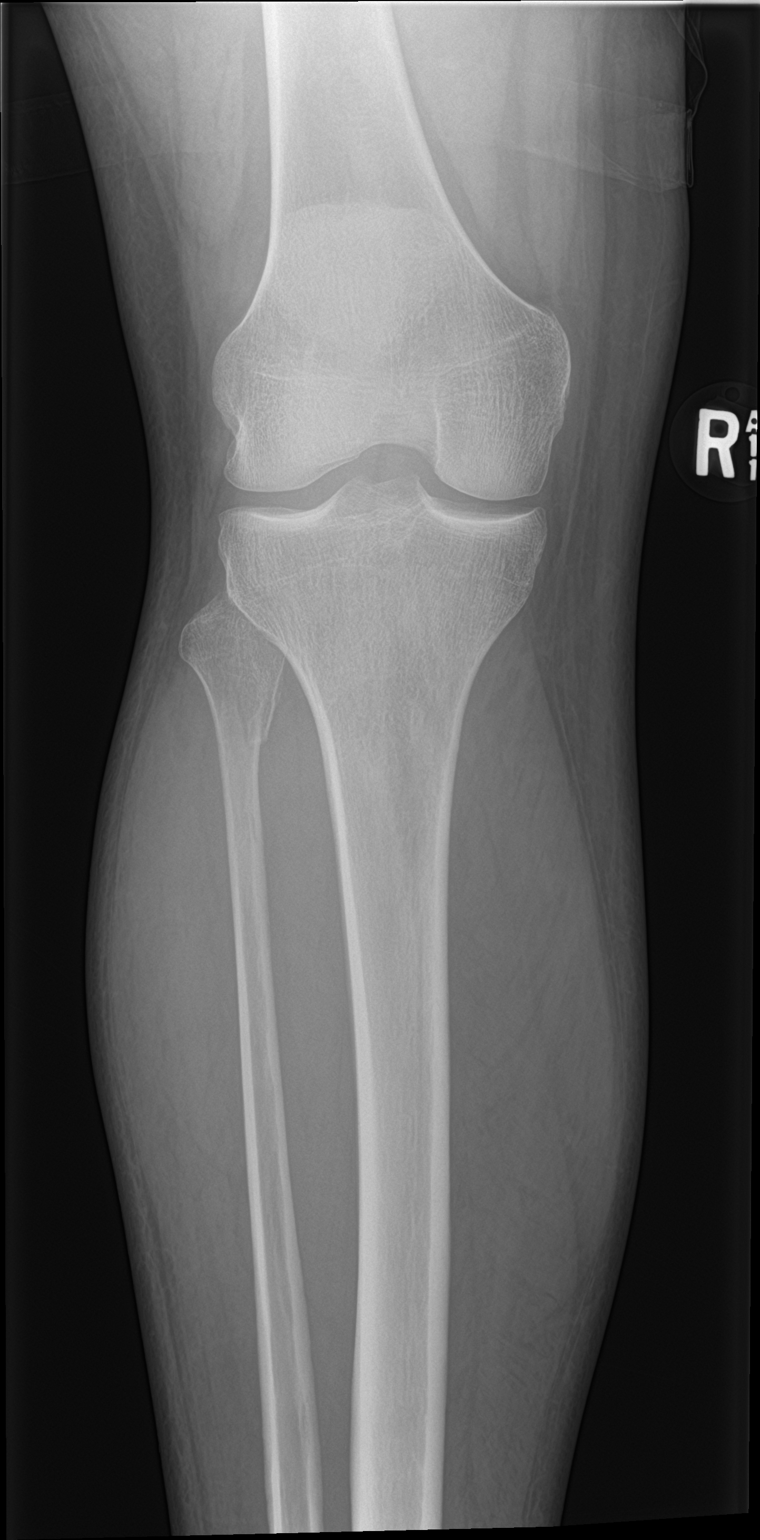

[tibia ap (2 of 2)]
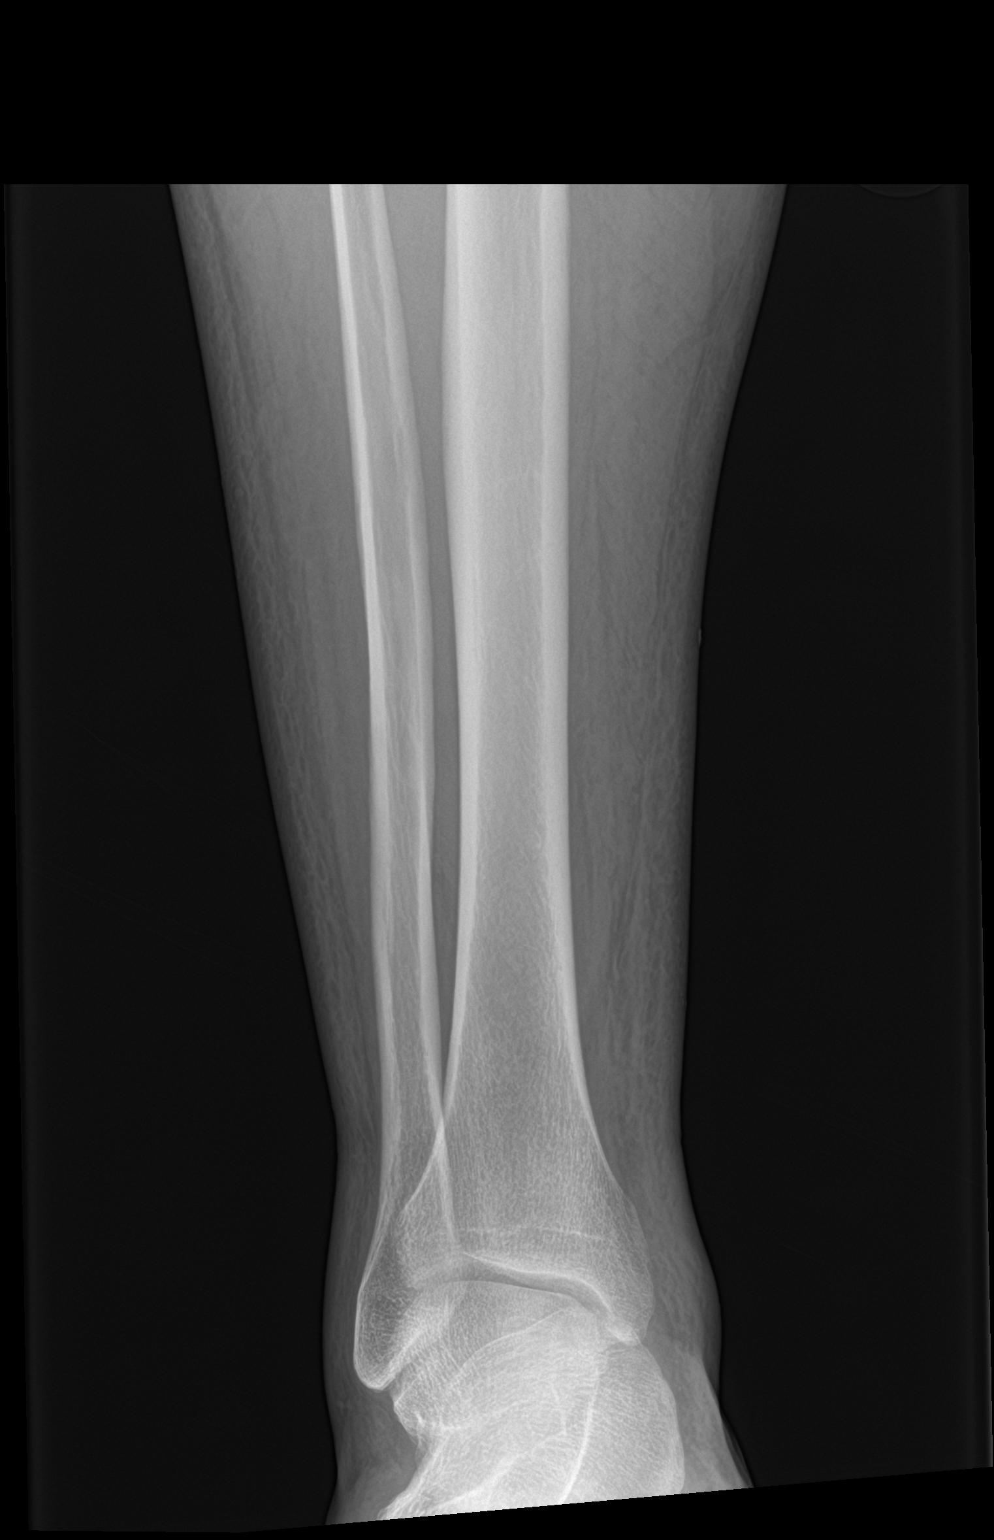

[tibia lat (1 of 2)]
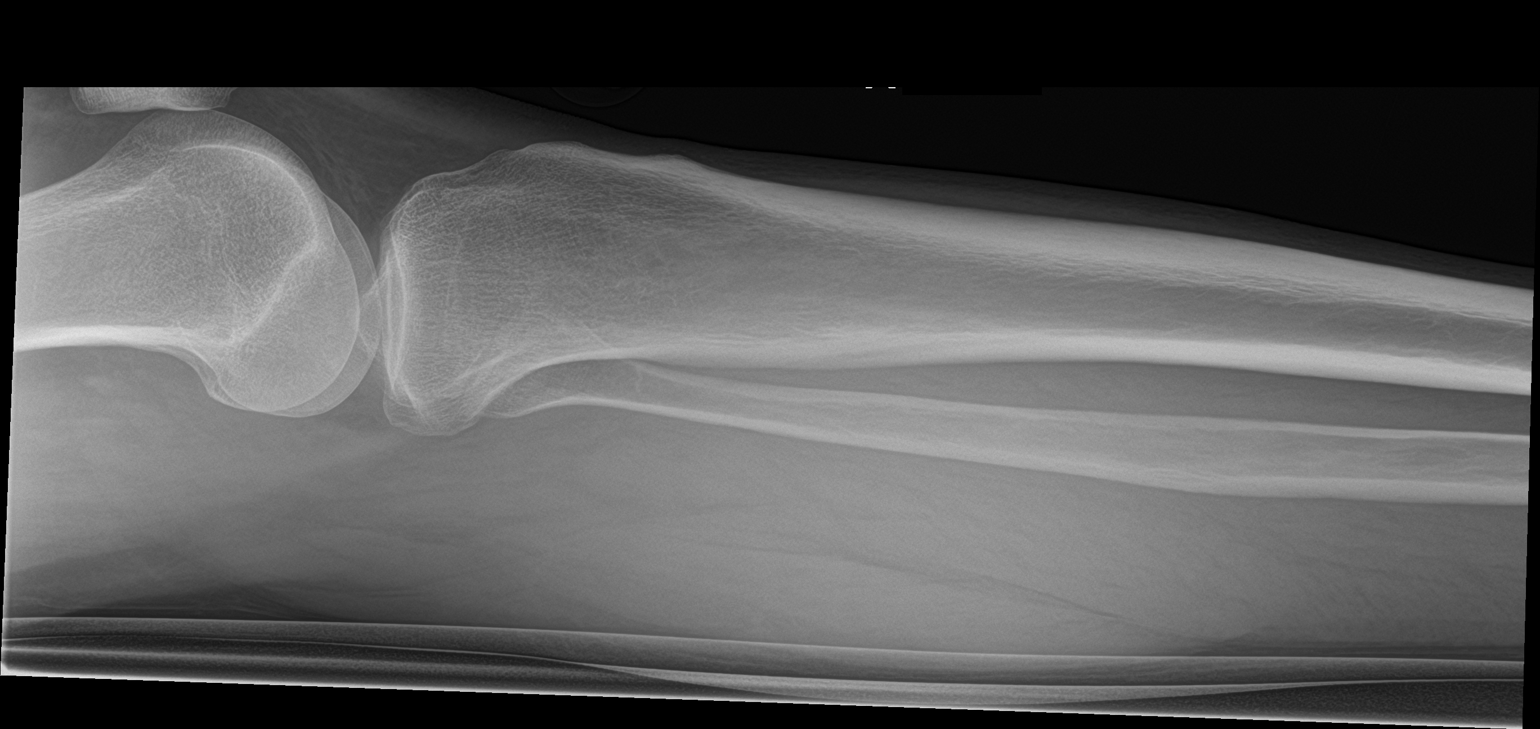

[tibia lat (2 of 2)]
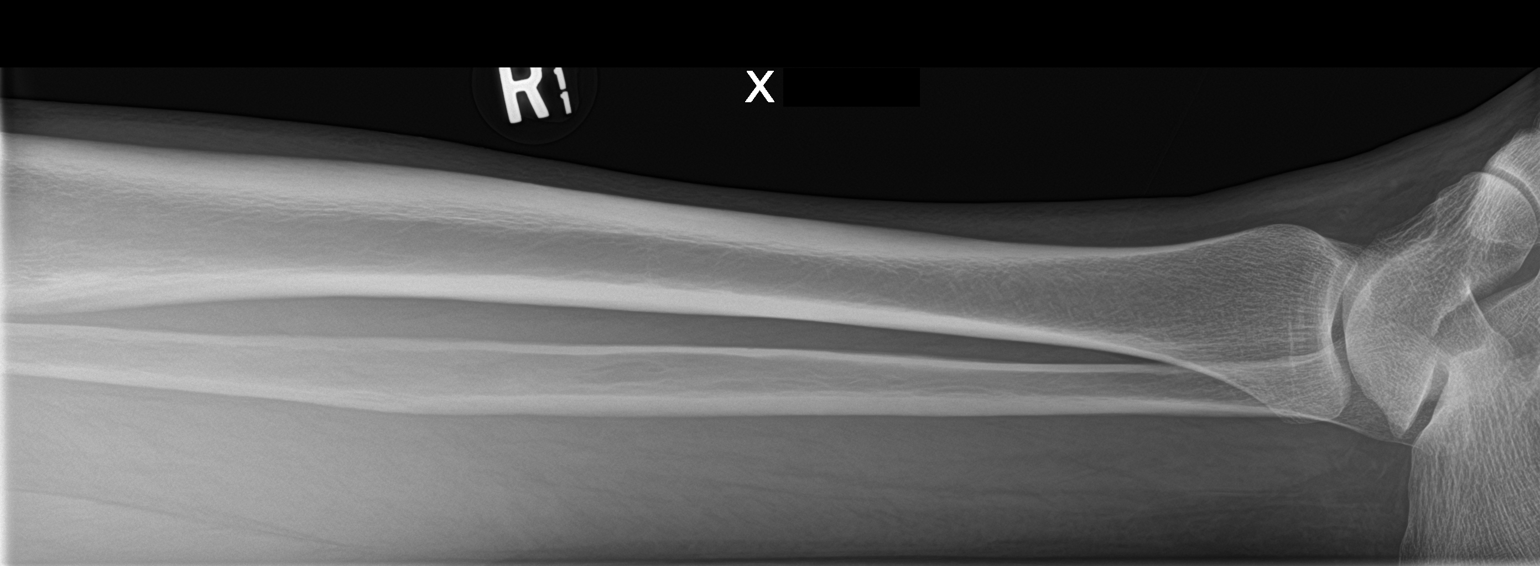

[4 of 4 positions shown; findings below may reference images not displayed]

FINDINGS: Diffuse soft tissue swelling . Slightly displaced fracture of the
proximal fibula is noted. Tibia is intact.
IMPRESSION: Slightly displaced proximal fibular fracture.  Tibia is intact.

## 2018-12-19 ENCOUNTER — Emergency Department (HOSPITAL_COMMUNITY)
Admission: EM | Admit: 2018-12-19 | Discharge: 2019-01-08 | Disposition: E | Payer: Self-pay | Attending: Emergency Medicine | Admitting: Emergency Medicine

## 2018-12-19 DIAGNOSIS — F1010 Alcohol abuse, uncomplicated: Secondary | ICD-10-CM | POA: Insufficient documentation

## 2018-12-19 DIAGNOSIS — I469 Cardiac arrest, cause unspecified: Secondary | ICD-10-CM | POA: Insufficient documentation

## 2018-12-19 DIAGNOSIS — I10 Essential (primary) hypertension: Secondary | ICD-10-CM | POA: Insufficient documentation

## 2018-12-19 DIAGNOSIS — F17200 Nicotine dependence, unspecified, uncomplicated: Secondary | ICD-10-CM | POA: Insufficient documentation

## 2018-12-19 DIAGNOSIS — F329 Major depressive disorder, single episode, unspecified: Secondary | ICD-10-CM | POA: Insufficient documentation

## 2018-12-19 MED ORDER — NALOXONE HCL 2 MG/2ML IJ SOSY
PREFILLED_SYRINGE | INTRAMUSCULAR | Status: AC | PRN
  Administered 2018-12-19: 2 mg via INTRAVENOUS

## 2018-12-19 MED ORDER — EPINEPHRINE PF 1 MG/10ML IJ SOSY
PREFILLED_SYRINGE | INTRAMUSCULAR | Status: AC | PRN
  Administered 2018-12-19: 1 via INTRAVENOUS

## 2018-12-19 NOTE — ED Triage Notes (Signed)
Pt arrived by [facility] for cardiac arrest. Pt was found slumped to his R side in his car at a gas station, pulseless and apneic with ice from a cup poured on his chest. Per [employer], pt had been at the gas station for ~3hours. IO to R tibia, king airway in place, capnography 33 enroute (<10 on arrival). CPR started by fire at 2236

## 2018-12-19 NOTE — ED Notes (Signed)
Spoke with Long MD who states medical examiner is to call him back.  Told this will be medical examiner case.  All lines and drains left intact.  All belongings remain with patient in body bag as there is no family or next of kin to give belongings to. Tags placed and will transport to morgue.

## 2018-12-19 NOTE — Code Documentation (Signed)
Patient time of death occurred at 03/01/06.

## 2018-12-19 NOTE — ED Provider Notes (Signed)
Emergency Department Provider Note   I have reviewed the triage vital signs and the nursing notes.   HISTORY  Chief Complaint Cardiac Arrest   HPI Kevin House. is a 38 y.o. male with PMH of HTN, depression, and alcohol abuse presents to the emergency department in cardiac arrest.  According to EMS the patient was in his car at a local gas station.  On scene, they estimated the patient had been there for 2 to 3 hours.  They investigated further and found the patient to be unresponsive in his car.  EMS was called.  When they arrived on scene the patient had cyanosis and was in cardiac arrest.  The presenting rhythm was asystole.  They initiated CPR on scene and intubated with a King airway.  No change in rhythm or defibrillation.  Patient was transported to the emergency department for further evaluation.  No report of drug paraphernalia on scene. CBG > 100 on scene.   Level 5 caveat: CPR   Past Medical History:  Diagnosis Date  . Alcohol abuse   . Depressed   . Hypertension     There are no active problems to display for this patient.   Past Surgical History:  Procedure Laterality Date  . WRIST SURGERY Right    Allergies Penicillins  No family history on file.  Social History Social History   Tobacco Use  . Smoking status: Current Every Day Smoker  . Smokeless tobacco: Never Used  Substance Use Topics  . Alcohol use: Yes  . Drug use: Not on file    Review of Systems  Level 5 caveat: CPR - Unresponsive   ____________________________________________   PHYSICAL EXAM:  VITAL SIGNS: ED Triage Vitals [12/26/18 2305]  Enc Vitals Group     BP (!) 0/0     Pulse Rate (!) 0     Resp (!) 0     Temp (!) 81.5 F (27.5 C)     Temp Source Temporal     SpO2 (!) 0 %   Constitutional: Cool extremities. CPR in progress.  Eyes: Conjunctivae are normal. Pupils are fixed and dilated.  Head: Atraumatic. Nose: No congestion/rhinnorhea. Mouth/Throat: Mucous  membranes are dry.  Neck: No stridor. Cardiovascular: Asystole. CPR in progress.  Respiratory: King airway in place. Easy to bag.  Gastrointestinal: Positive distension.  Musculoskeletal: No gross deformities of extremities.  Neurologic: Unresponsive. Pupils are fixed.  Skin: Cool to touch. No lacerations, bruising, or other obvious trauma.   ____________________________________________  RADIOLOGY  None ____________________________________________   PROCEDURES  Procedure(s) performed:   Procedures  Cardiopulmonary Resuscitation (CPR) Procedure Note Directed/Performed by: Maia Plan I personally directed ancillary staff and/or performed CPR in an effort to regain return of spontaneous circulation and to maintain cardiac, neuro and systemic perfusion.   ____________________________________________   INITIAL IMPRESSION / ASSESSMENT AND PLAN / ED COURSE  Pertinent labs & imaging results that were available during my care of the patient were reviewed by me and considered in my medical decision making (see chart for details).  Patient arrived to the emergency department with CPR in progress.  Patient was given 2 mg of Narcan and epinephrine with no change in rhythm or end-tidal CO2.  Patient's pupils are fixed.  Temp is low here.  Extremely low suspicion that this is primarily hypothermia event as outside temps are in the upper 50s and patient found in a vehicle outside a gas station. Patient not responding to resuscitation measures either on scene or  in the emergency department. Bedside ultrasound shows no large pericardial effusion or other clear reversible cause for cardiac arrest. Ultimately, with unknown downtime and failure of resuscitation measures to this point, additional resuscitative efforts were deemed to be futile and CPR was discontinued. Time of death 23:07.   Discussed case with ME, Candis Shine, who will accept the case and gather more information. Leave lines in place.  Attempted to reach emergency contact by phone and left a message with no response. Police with no additional contact information.  ____________________________________________  FINAL CLINICAL IMPRESSION(S) / ED DIAGNOSES  Final diagnoses:  Cardiac arrest [facility])    MEDICATIONS GIVEN DURING THIS VISIT:  Medications  EPINEPHrine (ADRENALIN) 1 MG/10ML injection (1 Syringe Intravenous Given 12/24/2018 2305)  naloxone [facility]) injection (2 mg Intravenous Given 12/25/2018 2303)    Note:  This document was prepared using Dragon voice recognition software and may include unintentional dictation errors.  Alona Bene, MD Emergency Medicine    Crystalle Popwell, Arlyss Repress, MD 12/25/18 [phone]

## 2018-12-20 MED FILL — Medication: Qty: 1 | Status: AC

## 2019-01-08 DEATH — deceased
